# Patient Record
Sex: Male | Born: 1995 | Race: White | Hispanic: No | Marital: Single | Smoking: Never smoker
Health system: Southern US, Community
[De-identification: ages and names within clinical notes are randomized; demographics above are authoritative.]

## PROBLEM LIST (undated history)

## (undated) HISTORY — PX: TONSILLECTOMY: SUR1361

---
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
## 2016-02-11 DIAGNOSIS — S70362A Insect bite (nonvenomous), left thigh, initial encounter: Secondary | ICD-10-CM | POA: Diagnosis not present

## 2016-05-20 DIAGNOSIS — N50819 Testicular pain, unspecified: Secondary | ICD-10-CM | POA: Diagnosis not present

## 2016-05-20 DIAGNOSIS — R350 Frequency of micturition: Secondary | ICD-10-CM | POA: Diagnosis not present

## 2016-05-20 DIAGNOSIS — N50811 Right testicular pain: Secondary | ICD-10-CM | POA: Diagnosis not present

## 2016-05-22 DIAGNOSIS — N50811 Right testicular pain: Secondary | ICD-10-CM | POA: Diagnosis not present

## 2016-06-02 DIAGNOSIS — Z9889 Other specified postprocedural states: Secondary | ICD-10-CM | POA: Diagnosis not present

## 2016-06-02 DIAGNOSIS — R1030 Lower abdominal pain, unspecified: Secondary | ICD-10-CM | POA: Diagnosis not present

## 2016-06-02 DIAGNOSIS — R110 Nausea: Secondary | ICD-10-CM | POA: Diagnosis not present

## 2016-06-02 DIAGNOSIS — R102 Pelvic and perineal pain: Secondary | ICD-10-CM | POA: Diagnosis not present

## 2016-06-02 DIAGNOSIS — R1900 Intra-abdominal and pelvic swelling, mass and lump, unspecified site: Secondary | ICD-10-CM | POA: Diagnosis not present

## 2016-06-02 DIAGNOSIS — N451 Epididymitis: Secondary | ICD-10-CM | POA: Diagnosis not present

## 2016-06-11 DIAGNOSIS — N509 Disorder of male genital organs, unspecified: Secondary | ICD-10-CM | POA: Diagnosis not present

## 2016-06-16 DIAGNOSIS — J40 Bronchitis, not specified as acute or chronic: Secondary | ICD-10-CM | POA: Diagnosis not present

## 2016-06-16 DIAGNOSIS — R070 Pain in throat: Secondary | ICD-10-CM | POA: Diagnosis not present

## 2016-07-05 DIAGNOSIS — N509 Disorder of male genital organs, unspecified: Secondary | ICD-10-CM | POA: Diagnosis not present

## 2016-07-09 DIAGNOSIS — N509 Disorder of male genital organs, unspecified: Secondary | ICD-10-CM | POA: Diagnosis not present

## 2016-07-09 DIAGNOSIS — N50811 Right testicular pain: Secondary | ICD-10-CM | POA: Diagnosis not present

## 2016-07-17 DIAGNOSIS — R1030 Lower abdominal pain, unspecified: Secondary | ICD-10-CM | POA: Diagnosis not present

## 2016-07-17 DIAGNOSIS — M545 Low back pain: Secondary | ICD-10-CM | POA: Diagnosis not present

## 2016-07-22 ENCOUNTER — Emergency Department (HOSPITAL_COMMUNITY): Payer: BLUE CROSS/BLUE SHIELD

## 2016-07-22 ENCOUNTER — Encounter (HOSPITAL_COMMUNITY): Payer: Self-pay | Admitting: Emergency Medicine

## 2016-07-22 ENCOUNTER — Emergency Department (HOSPITAL_COMMUNITY)
Admission: EM | Admit: 2016-07-22 | Discharge: 2016-07-22 | Disposition: A | Discharge status: Home / Self Care | Payer: BLUE CROSS/BLUE SHIELD | Attending: Emergency Medicine | Admitting: Emergency Medicine

## 2016-07-22 DIAGNOSIS — X509XXA Other and unspecified overexertion or strenuous movements or postures, initial encounter: Secondary | ICD-10-CM | POA: Diagnosis not present

## 2016-07-22 DIAGNOSIS — Y999 Unspecified external cause status: Secondary | ICD-10-CM | POA: Diagnosis not present

## 2016-07-22 DIAGNOSIS — Y929 Unspecified place or not applicable: Secondary | ICD-10-CM | POA: Diagnosis not present

## 2016-07-22 DIAGNOSIS — M2560 Stiffness of unspecified joint, not elsewhere classified: Secondary | ICD-10-CM | POA: Diagnosis not present

## 2016-07-22 DIAGNOSIS — Y939 Activity, unspecified: Secondary | ICD-10-CM | POA: Diagnosis not present

## 2016-07-22 DIAGNOSIS — M542 Cervicalgia: Secondary | ICD-10-CM | POA: Diagnosis not present

## 2016-07-22 DIAGNOSIS — M436 Torticollis: Secondary | ICD-10-CM

## 2016-07-22 DIAGNOSIS — R221 Localized swelling, mass and lump, neck: Secondary | ICD-10-CM | POA: Diagnosis not present

## 2016-07-22 NOTE — ED Provider Notes (Signed)
[facility]-EMERGENCY DEPT Provider Note   CSN: [id number] Arrival date & time: 07/22/16  0046     History   Chief Complaint Chief Complaint  Patient presents with  . Neck Pain    HPI Carlos Hebert is a 21 y.o. male.  Patient with family history of anaphylaxis presents with complaint of right-sided neck pressure starting this evening while sitting watching a movie. Patient did not move or twist his neck. He states that he feels some pressure in this area when he swallows but is able to swallow. No difficulty breathing. No fevers, sore throat. No pain with opening and closing his mouth. He has not had any lip swelling or tongue swelling. No vomiting or diarrhea. No treatments prior to arrival. The onset of this condition was acute. The course is constant. Aggravating factors: none. Alleviating factors: none.        No past medical history on file.  There are no active problems to display for this patient.   Past Surgical History:  Procedure Laterality Date  . TONSILLECTOMY         Home Medications    Prior to Admission medications   Not on File    Family History No family history on file.  Social History Social History  Substance Use Topics  . Smoking status: Never Smoker  . Smokeless tobacco: Not on file  . Alcohol use No     Allergies   Patient has no known allergies.   Review of Systems Review of Systems  Constitutional: Negative for fever.  HENT: Negative for congestion, dental problem, rhinorrhea and sore throat.   Eyes: Negative for redness.  Respiratory: Negative for cough.   Cardiovascular: Negative for chest pain.  Gastrointestinal: Negative for abdominal pain, diarrhea, nausea and vomiting.  Genitourinary: Negative for dysuria.  Musculoskeletal: Positive for neck pain (pressure, not pain). Negative for myalgias.  Skin: Negative for rash.  Neurological: Negative for headaches.     Physical Exam Updated Vital Signs BP 129/76 (BP Location:  Left Arm)   Pulse 67   Temp 97.7 F (36.5 C) (Oral)   Resp 16   SpO2 100%   Physical Exam  Constitutional: He appears well-developed and well-nourished.  HENT:  Head: Normocephalic and atraumatic.  Right Ear: Hearing and tympanic membrane normal.  Left Ear: Hearing and tympanic membrane normal.  Nose: Nose normal. No mucosal edema.  Mouth/Throat: Uvula is midline, oropharynx is clear and moist and mucous membranes are normal. No oropharyngeal exudate, posterior oropharyngeal edema or posterior oropharyngeal erythema.  No trismus. No pain with movement of the neck. Decreased range of motion of the neck. No angioedema or signs of anaphylaxis.  Eyes: Conjunctivae are normal. Right eye exhibits no discharge. Left eye exhibits no discharge.  Neck: Normal range of motion. Neck supple.  Cardiovascular: Normal rate, regular rhythm and normal heart sounds.   Pulmonary/Chest: Effort normal and breath sounds normal.  Abdominal: Soft. There is no tenderness.  Lymphadenopathy:    He has no cervical adenopathy.  Neurological: He is alert.  Skin: Skin is warm and dry.  Psychiatric: He has a normal mood and affect.  Nursing note and vitals reviewed.    ED Treatments / Results   Radiology Dg Neck Soft Tissue  Result Date: 07/22/2016 CLINICAL DATA:  Felt a pop in his neck this morning. Now with sensation of pressure and swelling on the right side. No trauma. EXAM: NECK SOFT TISSUES - 1+ VIEW COMPARISON:  None. FINDINGS: There is no evidence of  retropharyngeal soft tissue swelling or epiglottic enlargement. The cervical airway is unremarkable and no radio-opaque foreign body identified. IMPRESSION: Negative. Electronically Signed   By: Ellery Plunkaniel R Mitchell M.D.   On: 07/22/2016 03:53    Procedures Procedures (including critical care time)   Initial Impression / Assessment and Plan / ED Course  I have reviewed the triage vital signs and the nursing notes.  Pertinent labs & imaging results that  were available during my care of the patient were reviewed by me and considered in my medical decision making (see chart for details).  Clinical Course    Patient seen and examined. Plain soft tissue film ordered. Discussed with patient and family, no emergent symptoms of deep space infection or obstruction noted on exam.  Vital signs reviewed and are as follows: BP 129/76 (BP Location: Left Arm)   Pulse 67   Temp 97.7 F (36.5 C) (Oral)   Resp 16   SpO2 100%   Patient and family updated on x-ray results. Encouraged warm compresses, anti-inflammatories empirically. Encouraged return to the emergency department with difficulty breathing or swallowing, development of pain with movement of the neck, opening and closing the mouth, new symptoms or other concerns. They verbalized understanding and agree with plan.    Final Clinical Impressions(s) / ED Diagnoses   Final diagnoses:  Neck stiffness   Patients with pressure sensation in right side of neck. No associated fever or infection symptoms. No trismus or pain with movement of the neck. No difficulty swallowing or breathing. Plain film x-ray shows pain airway without enlargement of epiglottis. At this point, no indication for advanced imaging of the neck. Patient is in no distress. He essentially has a normal physical exam. Return instructions as above.  New Prescriptions There are no discharge medications for this patient.    Renne CriglerJoshua Verda Mehta, PA-C 07/22/16 [id number]    Derwood KaplanAnkit Nanavati, MD 07/22/16 [phone]

## 2016-07-22 NOTE — ED Triage Notes (Signed)
Pt states that he was sitting at dinner and felt a pop in the R side of his neck and now feels pressure there. Alert and oriented. Airway intact.

## 2016-07-22 NOTE — Discharge Instructions (Signed)
Please read and follow all provided instructions.  Your diagnoses today include:  1. Neck stiffness     Tests performed today include:  X-ray of neck - shows no problems with airway  Vital signs. See below for your results today.   Medications prescribed:   None  Take any prescribed medications only as directed.  Home care instructions:  Follow any educational materials contained in this packet.  BE VERY CAREFUL not to take multiple medicines containing Tylenol (also called acetaminophen). Doing so can lead to an overdose which can damage your liver and cause liver failure and possibly death.   Follow-up instructions: Please follow-up with your primary care provider in the next 3 days for further evaluation of your symptoms.   Return instructions:   Please return to the Emergency Department if you experience worsening symptoms.   Return with difficulty swallowing or breathing, fever, swelling, difficulty opening mouth.  Please return if you have any other emergent concerns.  Additional Information:  Your vital signs today were: BP 129/76 (BP Location: Left Arm)    Pulse 67    Temp 97.7 F (36.5 C) (Oral)    Resp 16    SpO2 100%  If your blood pressure (BP) was elevated above 135/85 this visit, please have this repeated by your doctor within one month. --------------

## 2016-12-16 DIAGNOSIS — R1030 Lower abdominal pain, unspecified: Secondary | ICD-10-CM | POA: Diagnosis not present

## 2017-04-11 DIAGNOSIS — L659 Nonscarring hair loss, unspecified: Secondary | ICD-10-CM | POA: Diagnosis not present

## 2017-07-26 DIAGNOSIS — J069 Acute upper respiratory infection, unspecified: Secondary | ICD-10-CM | POA: Diagnosis not present

## 2017-07-26 DIAGNOSIS — J029 Acute pharyngitis, unspecified: Secondary | ICD-10-CM | POA: Diagnosis not present

## 2017-08-30 IMAGING — CT CT ABD-PELV W/ CM
2 of 4 series · 16 of 46 positions shown, 18 images · IV contrast (omnipaque)
Comparison: None.

CLINICAL DATA: Abdominal pain and nausea today. Constipation and
diarrhea last night. Initial encounter.

EXAM:
CT ABDOMEN AND PELVIS WITH CONTRAST
TECHNIQUE: Multidetector CT imaging of the abdomen and pelvis was performed
using the standard protocol following bolus administration of
intravenous contrast.
CONTRAST:  100 mL OMNIPAQUE IOHEXOL 300 MG/ML  SOLN

[Series 2: axial st · axial · 0.82mm/px · z∈[-576,-146]mm · 13 of 94 slices shown, 15 images]
[im 4/94  soft-tissue]
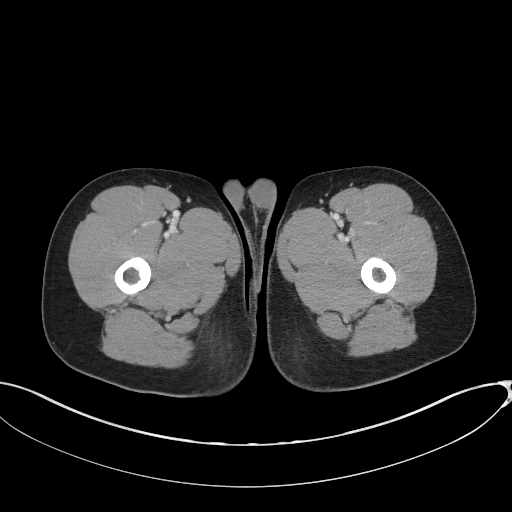
[im 4/94  bone]
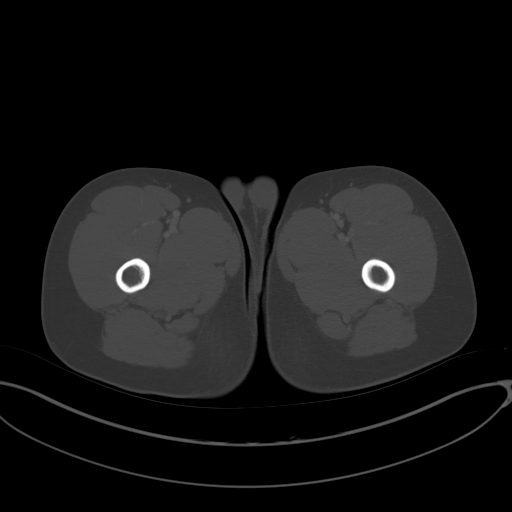
[im 11/94  soft-tissue]
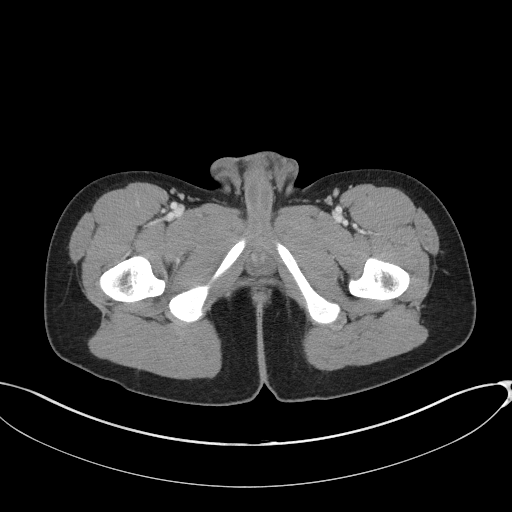
[im 18/94  soft-tissue]
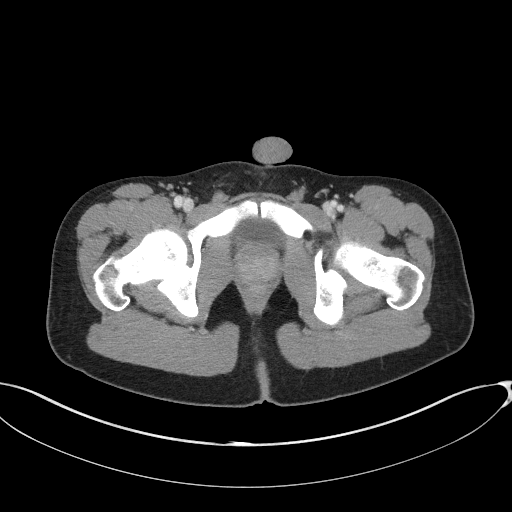
[im 26/94  soft-tissue]
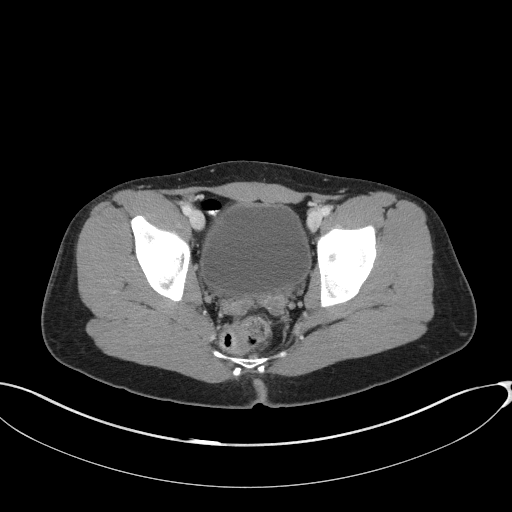
[im 33/94  soft-tissue]
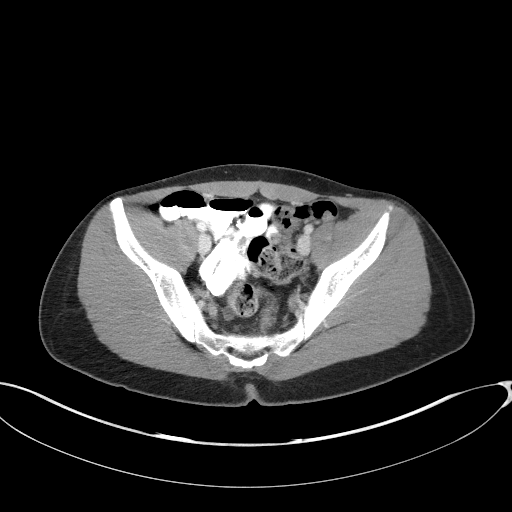
[im 40/94  soft-tissue]
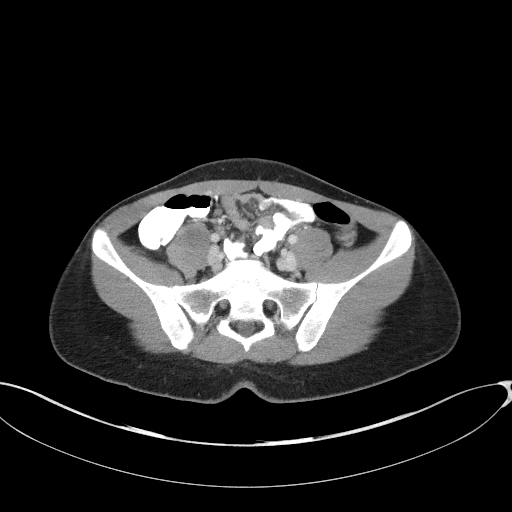
[im 47/94  soft-tissue]
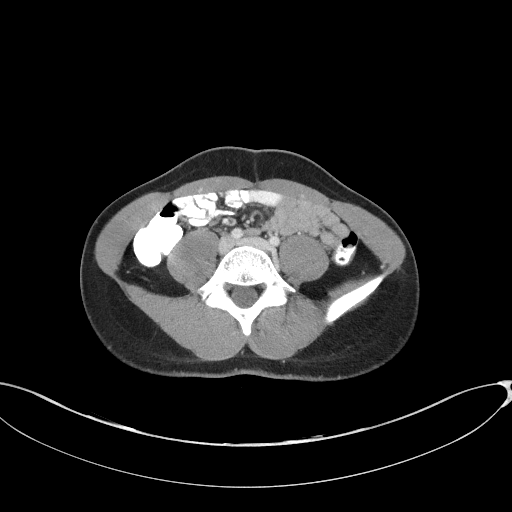
[im 54/94  soft-tissue]
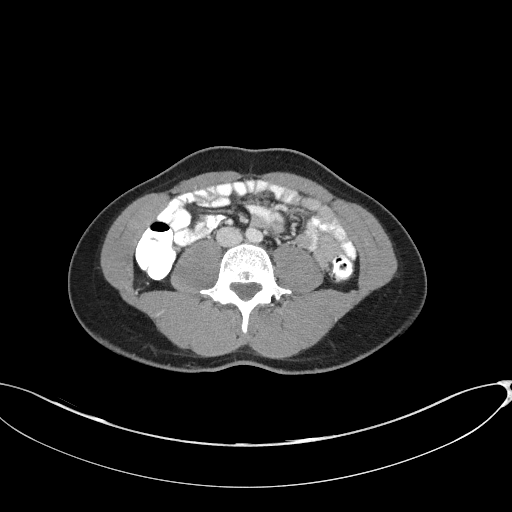
[im 61/94  soft-tissue]
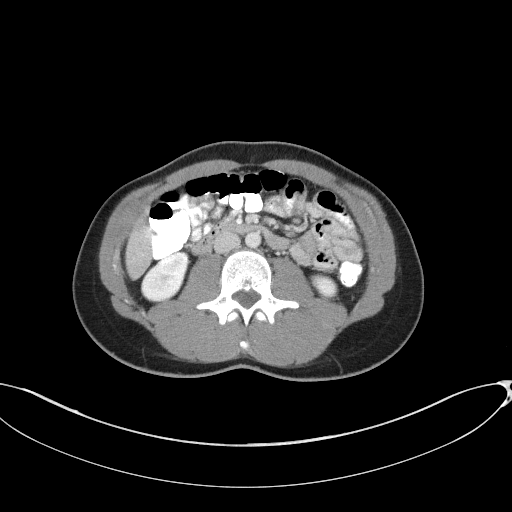
[im 61/94  bone]
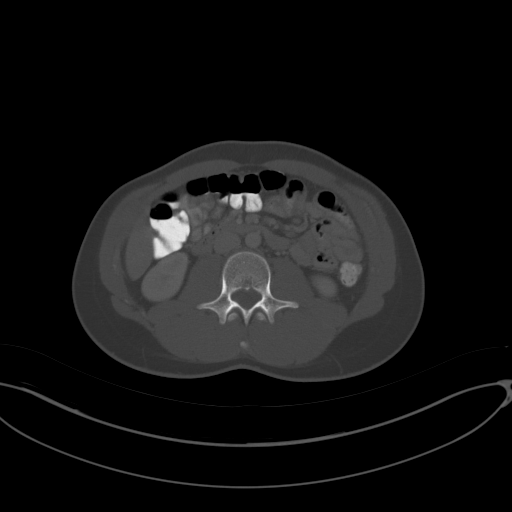
[im 68/94  soft-tissue]
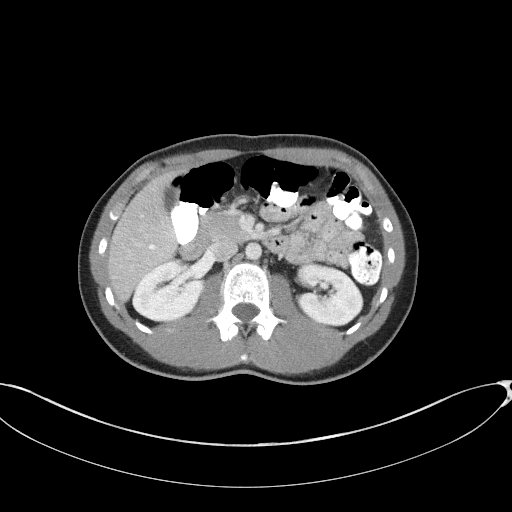
[im 76/94  soft-tissue]
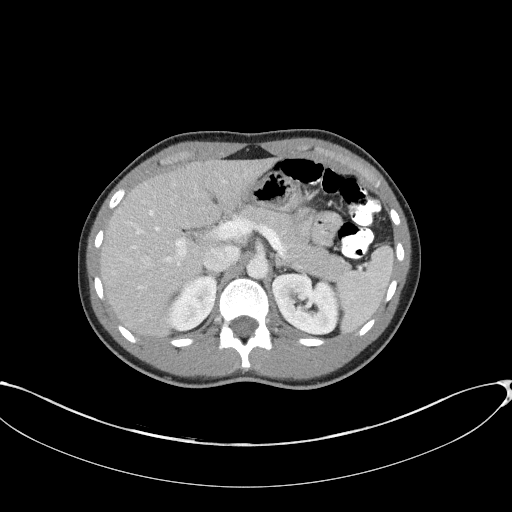
[im 83/94  soft-tissue]
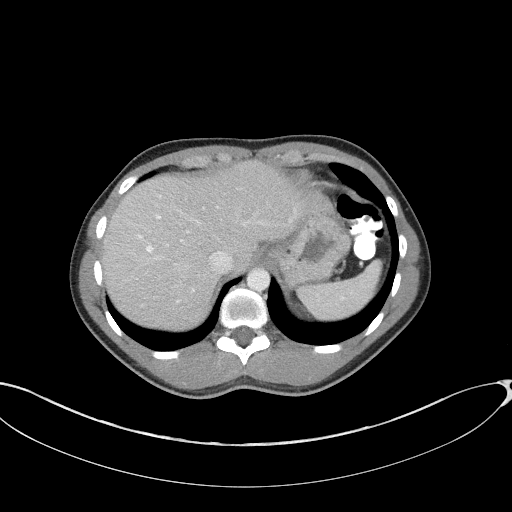
[im 90/94  soft-tissue]
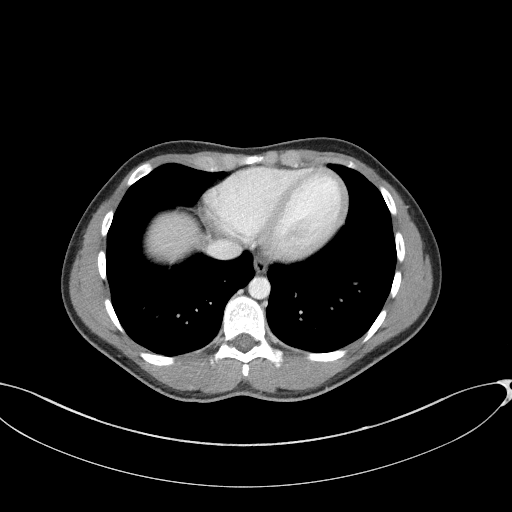

[Series 5: coronal st · coronal · 0.74mm/px · 3 of 81 slices shown]
[im 27/81  soft-tissue]
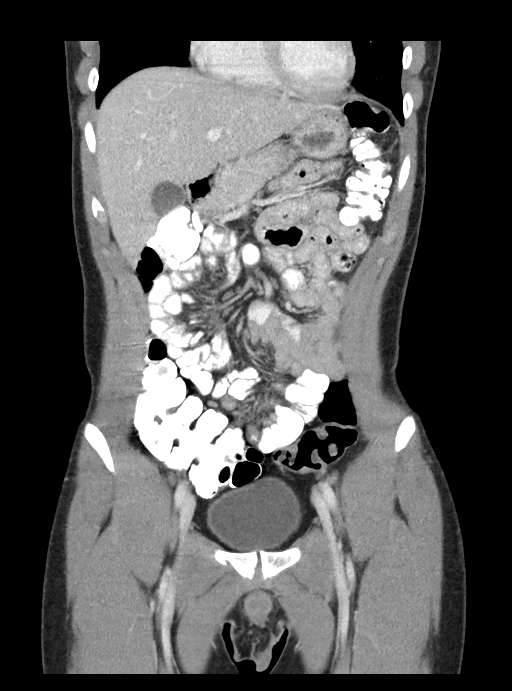
[im 36/81  soft-tissue]
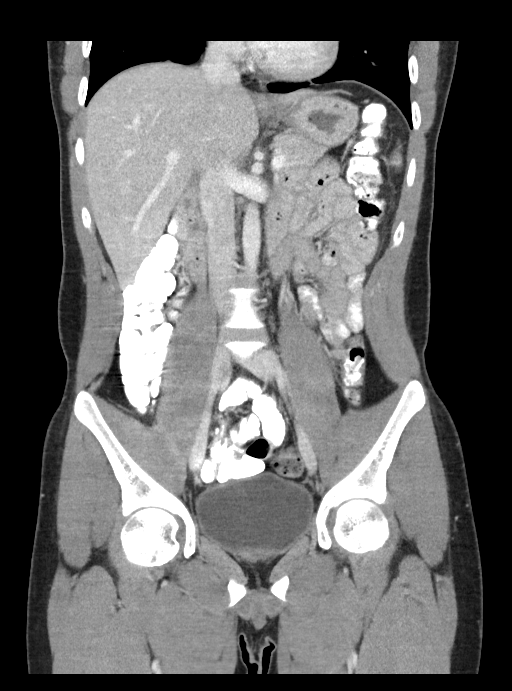
[im 45/81  soft-tissue]
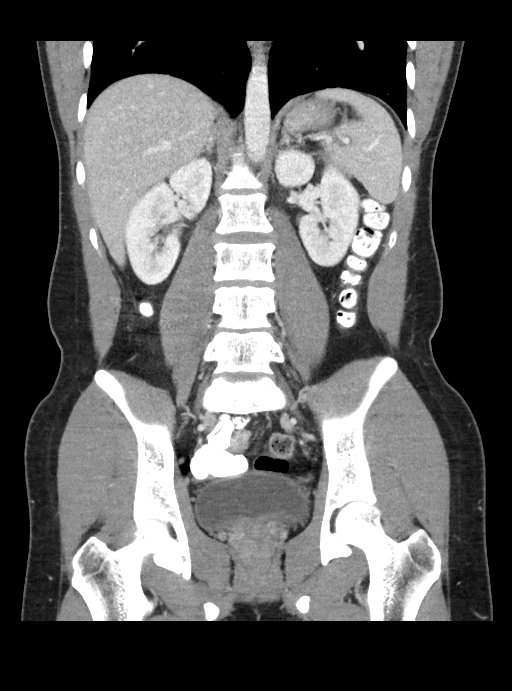

[16 of 46 positions shown; findings below may reference images not displayed]

FINDINGS: The lung bases are clear.  No pleural or pericardial effusion.

The liver, gallbladder, spleen, adrenal glands, pancreas and kidneys
appear normal.

There is a tiny amount of free pelvic fluid. The appendix is mildly
dilated at 0.9 cm with indistinctness and increased enhancement of
its wall consistent with acute appendicitis. The stomach and small
and large bowel appear normal. There is no lymphadenopathy.

No focal bony abnormality is identified.
IMPRESSION: Findings with acute appendicitis. These results were called by
telephone at the time of interpretation on 08/18/2015 at [DATE] to
Dr. Rakije Sahic Skrijelj, who verbally acknowledged these results.

## 2018-04-21 DIAGNOSIS — L723 Sebaceous cyst: Secondary | ICD-10-CM | POA: Diagnosis not present

## 2018-04-21 DIAGNOSIS — Z6822 Body mass index (BMI) 22.0-22.9, adult: Secondary | ICD-10-CM | POA: Diagnosis not present

## 2018-06-09 DIAGNOSIS — Z6822 Body mass index (BMI) 22.0-22.9, adult: Secondary | ICD-10-CM | POA: Diagnosis not present

## 2018-06-09 DIAGNOSIS — L089 Local infection of the skin and subcutaneous tissue, unspecified: Secondary | ICD-10-CM | POA: Diagnosis not present

## 2018-06-09 DIAGNOSIS — L723 Sebaceous cyst: Secondary | ICD-10-CM | POA: Diagnosis not present

## 2018-07-16 DIAGNOSIS — N509 Disorder of male genital organs, unspecified: Secondary | ICD-10-CM | POA: Diagnosis not present

## 2018-08-04 IMAGING — CR DG NECK SOFT TISSUE
2 series · 2 of 2 positions shown · non-contrast
Comparison: None.

CLINICAL DATA: Felt a pop in his neck this morning. Now with
sensation of pressure and swelling on the right side. No trauma.

EXAM:
NECK SOFT TISSUES - 1+ VIEW

[w soft tissue neck ap]
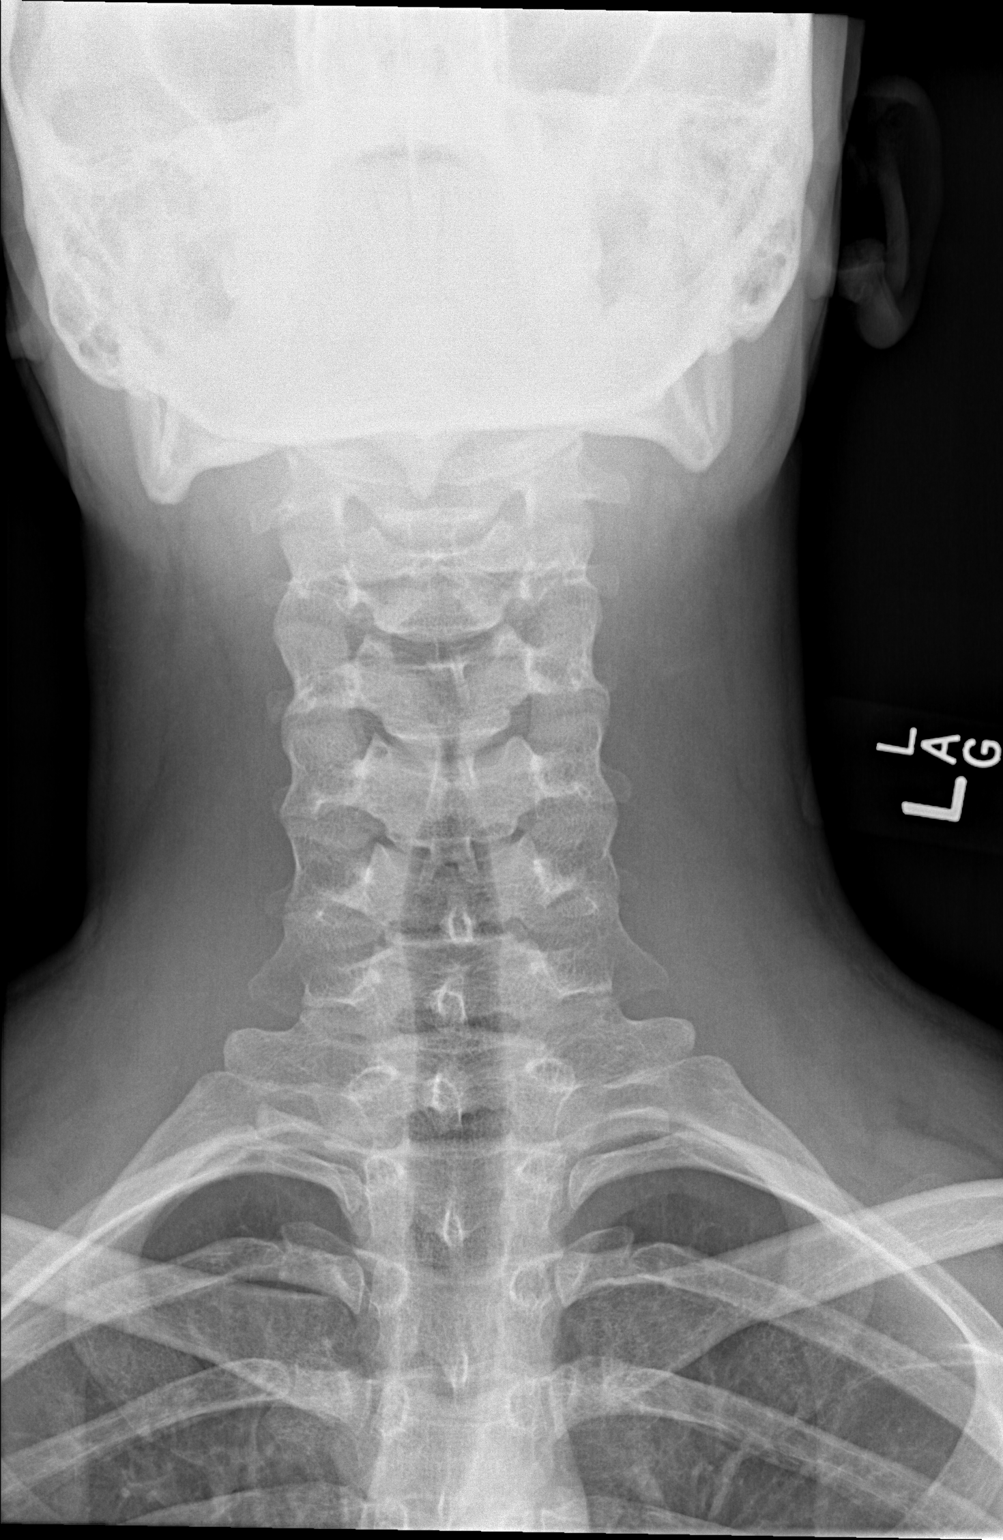

[w soft tissue neck lat]
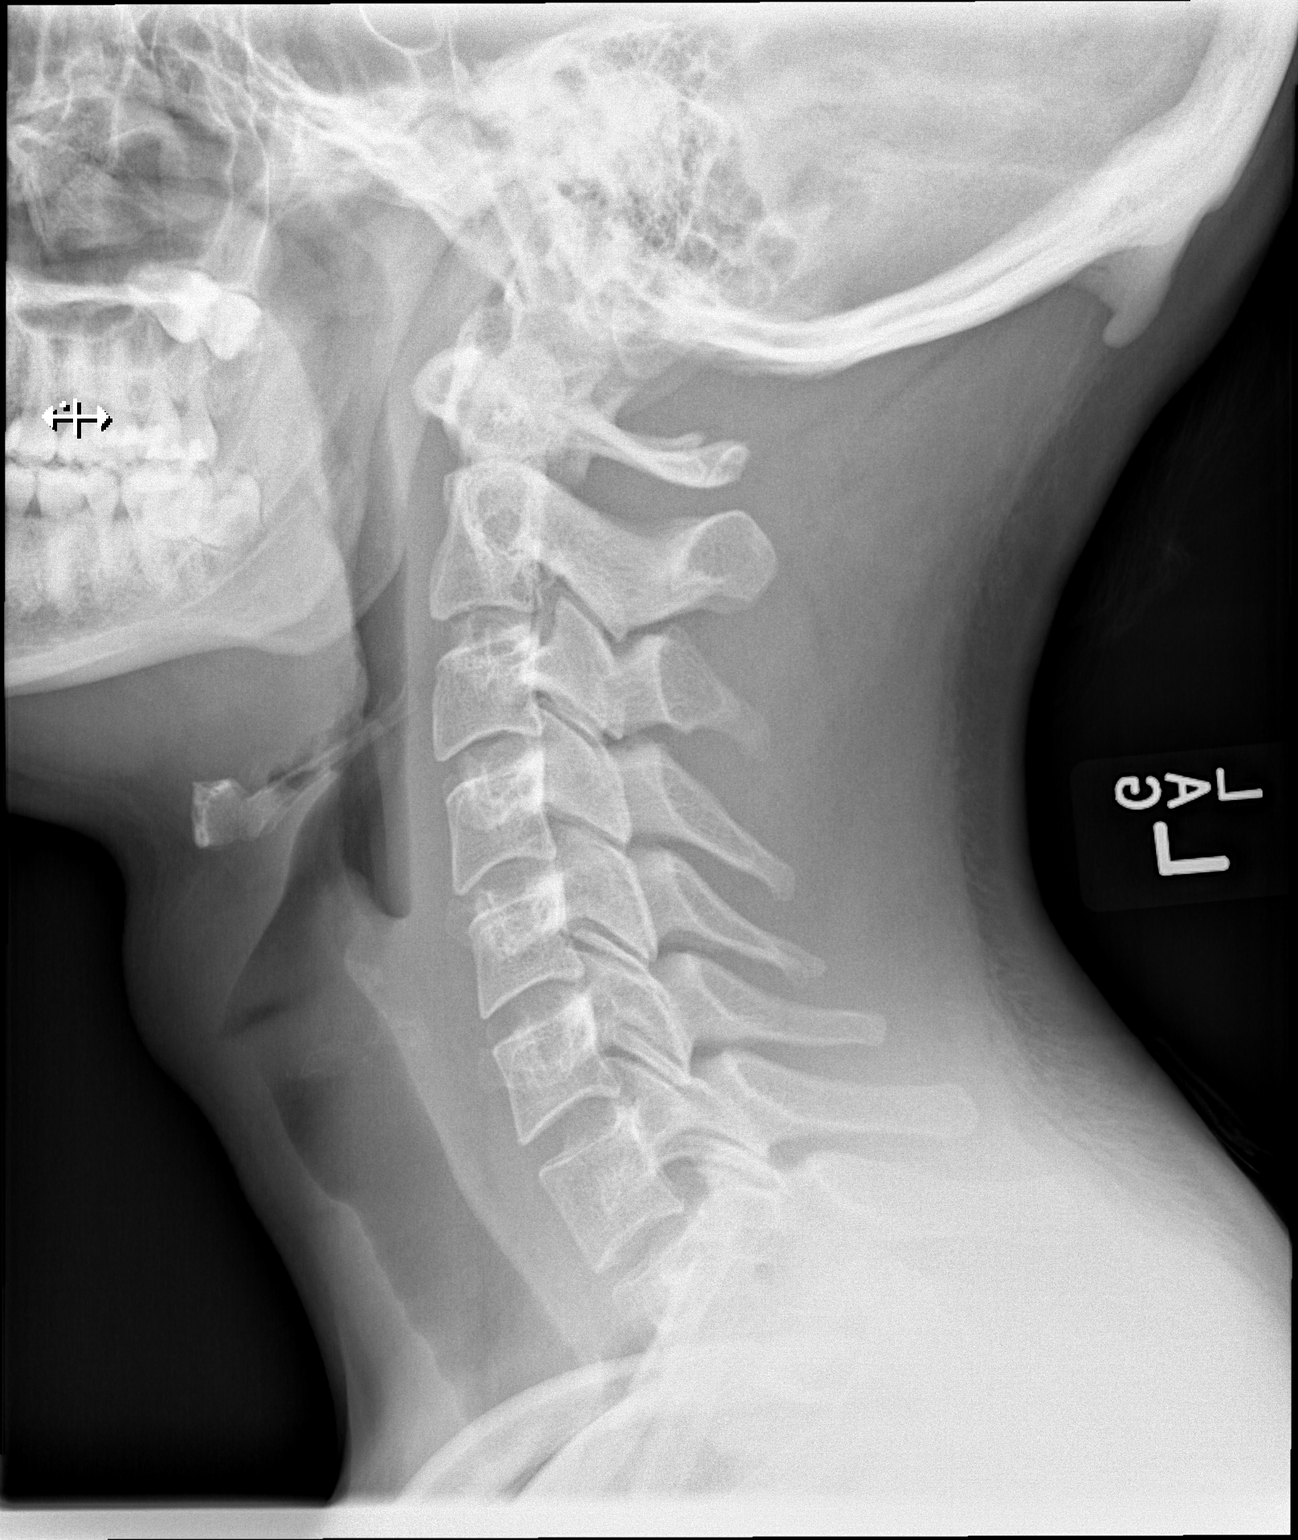

[2 of 2 positions shown; findings below may reference images not displayed]

FINDINGS: There is no evidence of retropharyngeal soft tissue swelling or
epiglottic enlargement. The cervical airway is unremarkable and no
radio-opaque foreign body identified.
IMPRESSION: Negative.

## 2018-08-28 DIAGNOSIS — Z0000 Encounter for general adult medical examination without abnormal findings: Secondary | ICD-10-CM | POA: Diagnosis not present

## 2018-09-09 DIAGNOSIS — Z13220 Encounter for screening for lipoid disorders: Secondary | ICD-10-CM | POA: Diagnosis not present

## 2018-09-09 DIAGNOSIS — R6882 Decreased libido: Secondary | ICD-10-CM | POA: Diagnosis not present

## 2018-09-09 DIAGNOSIS — Z23 Encounter for immunization: Secondary | ICD-10-CM | POA: Diagnosis not present

## 2018-09-09 DIAGNOSIS — Z0000 Encounter for general adult medical examination without abnormal findings: Secondary | ICD-10-CM | POA: Diagnosis not present

## 2018-11-26 DIAGNOSIS — L649 Androgenic alopecia, unspecified: Secondary | ICD-10-CM | POA: Diagnosis not present

## 2018-11-26 DIAGNOSIS — L700 Acne vulgaris: Secondary | ICD-10-CM | POA: Diagnosis not present

## 2019-03-26 DIAGNOSIS — L649 Androgenic alopecia, unspecified: Secondary | ICD-10-CM | POA: Diagnosis not present

## 2019-06-28 DIAGNOSIS — L649 Androgenic alopecia, unspecified: Secondary | ICD-10-CM | POA: Diagnosis not present

## 2019-06-28 DIAGNOSIS — L709 Acne, unspecified: Secondary | ICD-10-CM | POA: Diagnosis not present

## 2019-06-28 DIAGNOSIS — E279 Disorder of adrenal gland, unspecified: Secondary | ICD-10-CM | POA: Diagnosis not present

## 2019-07-01 DIAGNOSIS — E279 Disorder of adrenal gland, unspecified: Secondary | ICD-10-CM | POA: Diagnosis not present

## 2019-08-03 DIAGNOSIS — E279 Disorder of adrenal gland, unspecified: Secondary | ICD-10-CM | POA: Diagnosis not present

## 2019-09-23 DIAGNOSIS — Z20828 Contact with and (suspected) exposure to other viral communicable diseases: Secondary | ICD-10-CM | POA: Diagnosis not present

## 2019-09-23 DIAGNOSIS — Z03818 Encounter for observation for suspected exposure to other biological agents ruled out: Secondary | ICD-10-CM | POA: Diagnosis not present

## 2019-10-12 DIAGNOSIS — L649 Androgenic alopecia, unspecified: Secondary | ICD-10-CM | POA: Diagnosis not present

## 2019-10-12 DIAGNOSIS — Z5181 Encounter for therapeutic drug level monitoring: Secondary | ICD-10-CM | POA: Diagnosis not present

## 2020-07-18 DIAGNOSIS — D225 Melanocytic nevi of trunk: Secondary | ICD-10-CM | POA: Diagnosis not present

## 2020-07-18 DIAGNOSIS — L700 Acne vulgaris: Secondary | ICD-10-CM | POA: Diagnosis not present

## 2020-07-18 DIAGNOSIS — D485 Neoplasm of uncertain behavior of skin: Secondary | ICD-10-CM | POA: Diagnosis not present
